# Patient Record
Sex: Male | Born: 1985 | Race: White | Hispanic: No | Marital: Single | Smoking: Never smoker
Health system: Southern US, Community
[De-identification: ages and names within clinical notes are randomized; demographics above are authoritative.]

## PROBLEM LIST (undated history)

## (undated) DIAGNOSIS — F329 Major depressive disorder, single episode, unspecified: Secondary | ICD-10-CM

## (undated) DIAGNOSIS — F32A Depression, unspecified: Secondary | ICD-10-CM

---

## 2006-09-10 ENCOUNTER — Emergency Department (HOSPITAL_COMMUNITY): Admission: EM | Admit: 2006-09-10 | Discharge: 2006-09-10 | Payer: Self-pay | Admitting: Emergency Medicine

## 2007-08-14 IMAGING — CR DG FOOT COMPLETE 3+V*R*
3 series · 3 of 3 positions shown · non-contrast
Comparison: none

CLINICAL DATA: Trauma to the foot.

RIGHT FOOT - 3 VIEW

[view not recorded (1 of 3)]
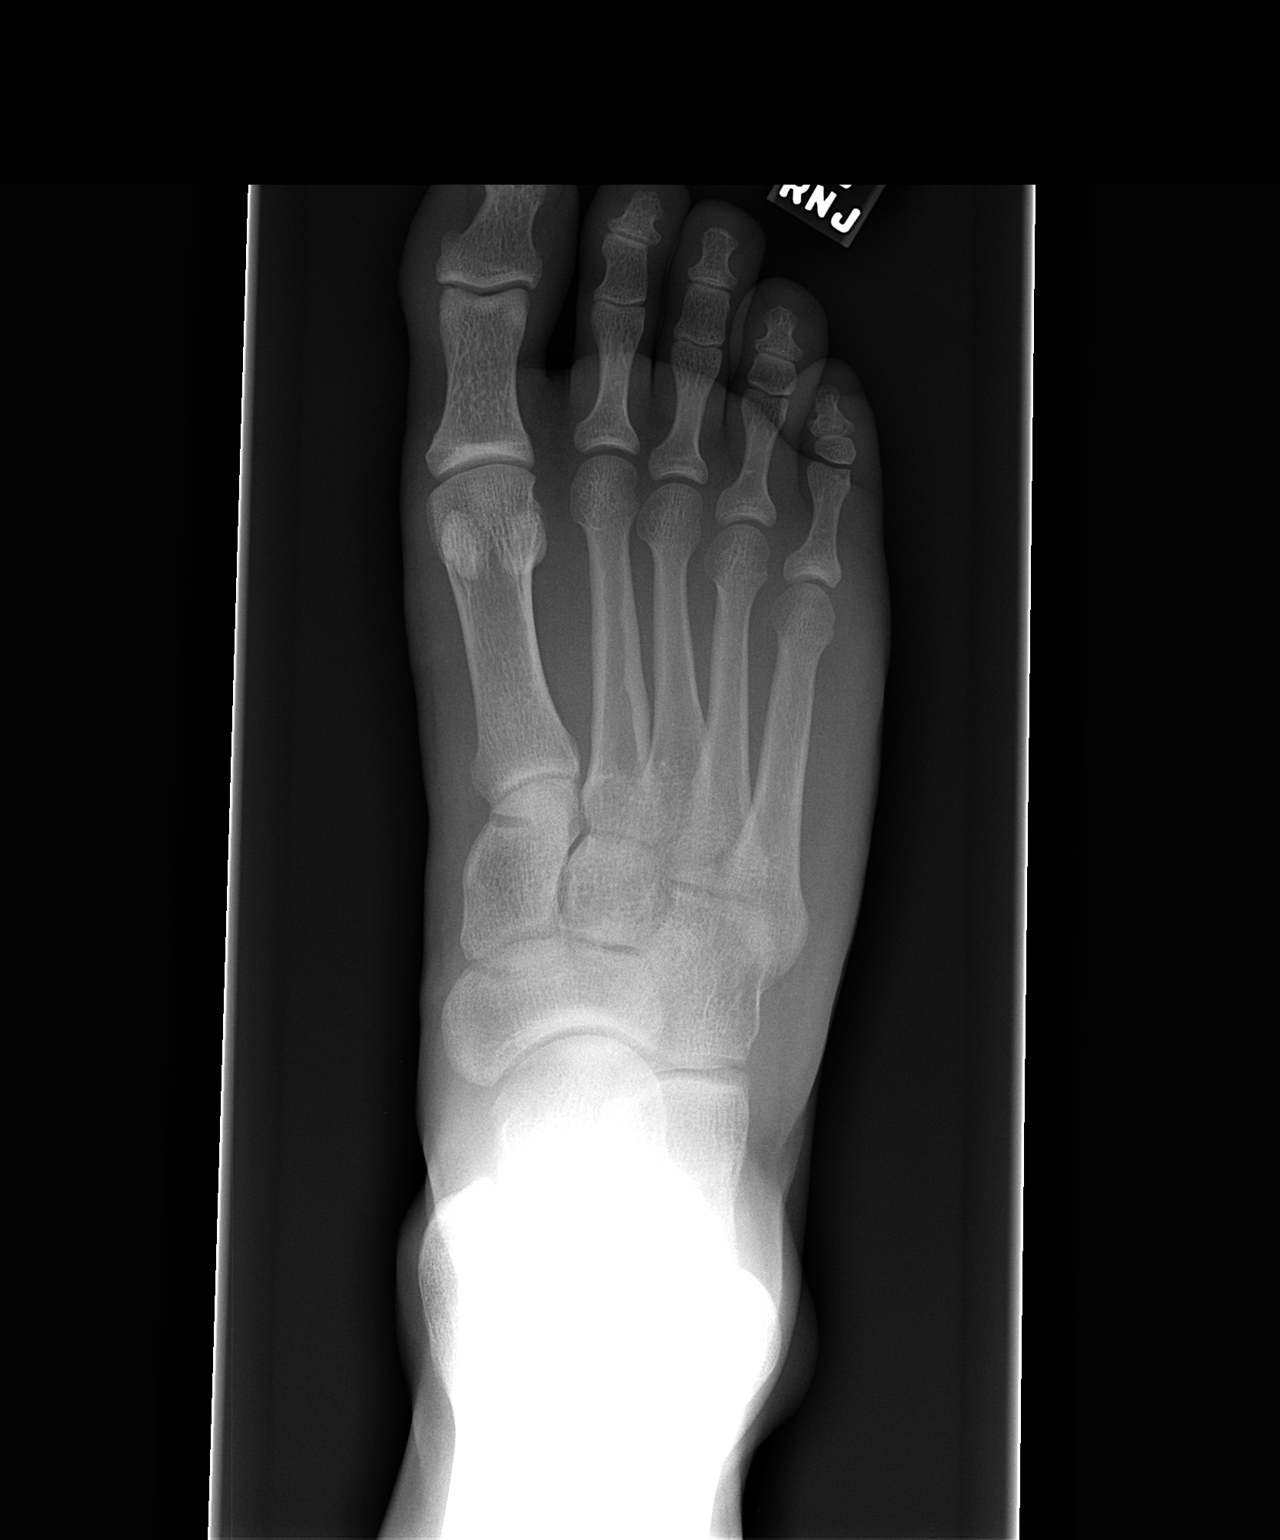

[view not recorded (2 of 3)]
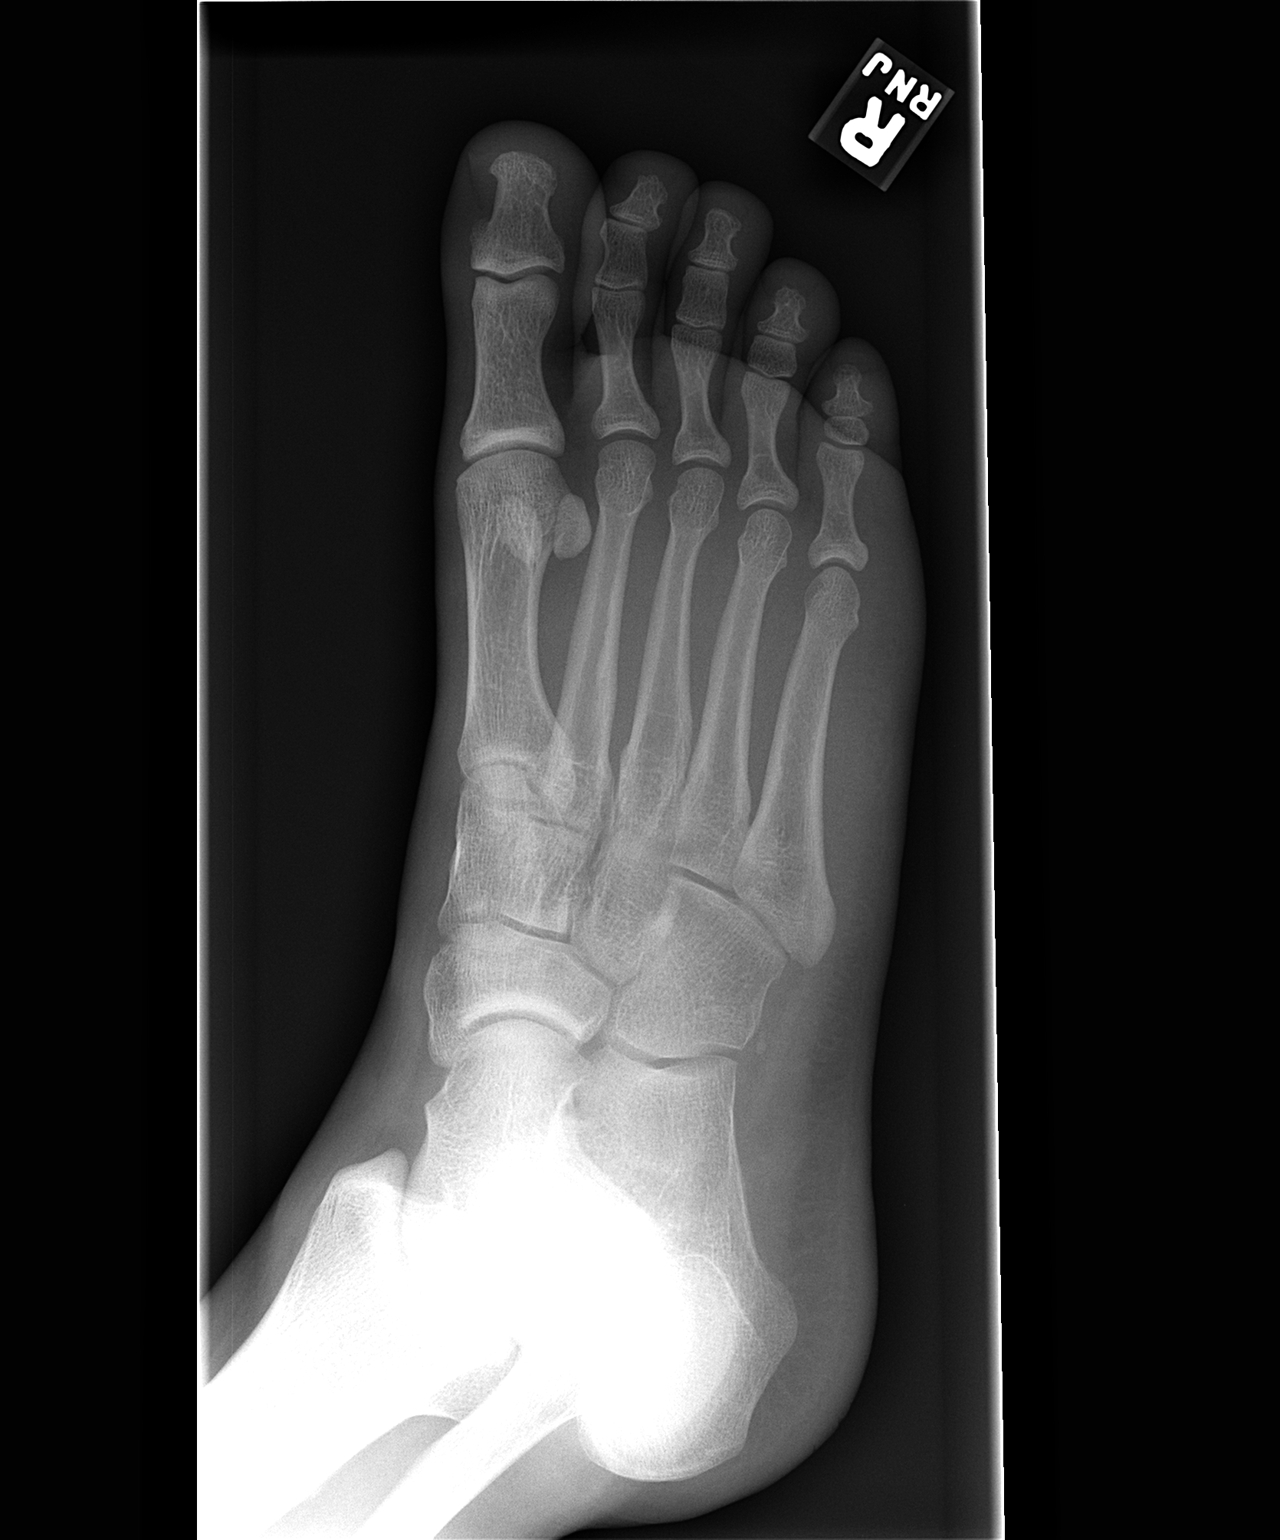

[view not recorded (3 of 3)]
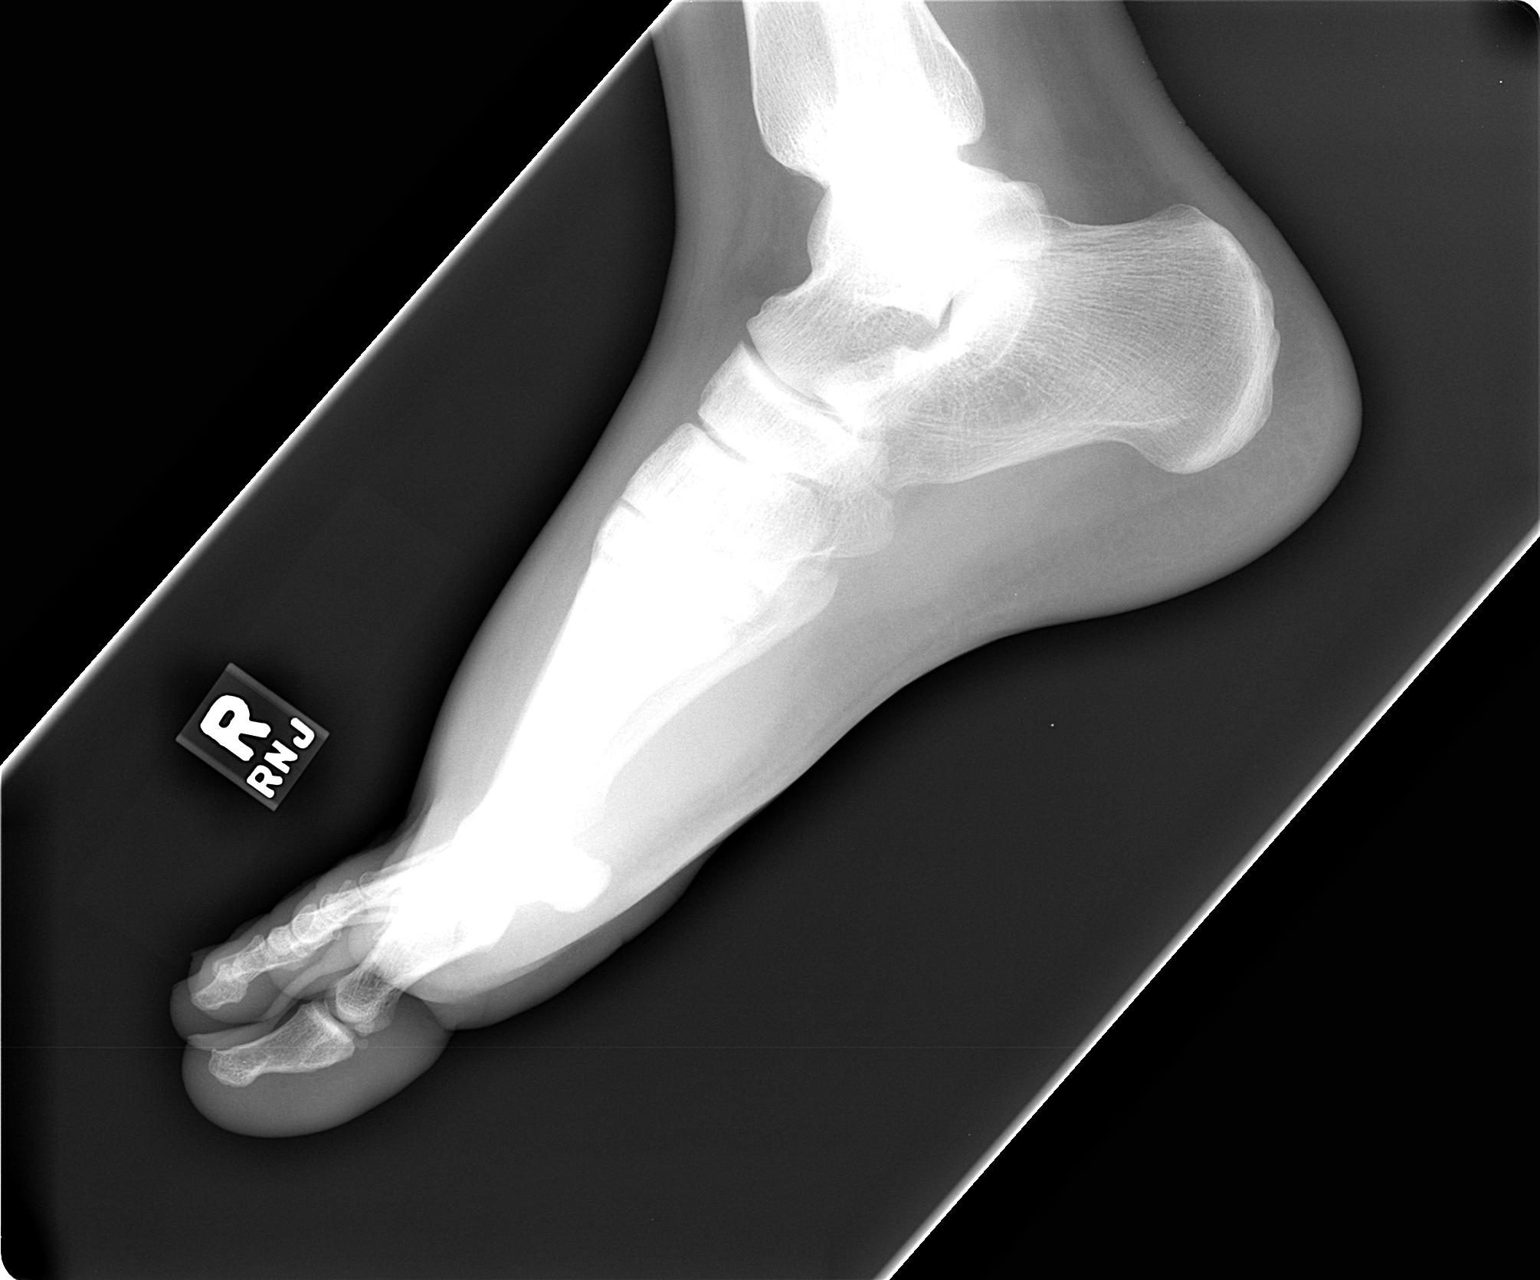

[3 of 3 positions shown; findings below may reference images not displayed]

FINDINGS: Soft tissue swelling overlies the dorsum of the metatarsals.
Alignment at the Lisfranc joint appears normal. No visible fracture. No
dislocation is identified. Incidental is made of an os peroneus.

IMPRESSION

1. No acute bony findings. However, there is dorsal soft tissue swelling.

## 2013-10-16 ENCOUNTER — Emergency Department (HOSPITAL_COMMUNITY)
Admission: EM | Admit: 2013-10-16 | Discharge: 2013-10-16 | Disposition: A | Discharge status: Home / Self Care | Payer: Worker's Compensation | Attending: Emergency Medicine | Admitting: Emergency Medicine

## 2013-10-16 ENCOUNTER — Encounter (HOSPITAL_COMMUNITY): Payer: Self-pay | Admitting: Emergency Medicine

## 2013-10-16 DIAGNOSIS — Y929 Unspecified place or not applicable: Secondary | ICD-10-CM | POA: Insufficient documentation

## 2013-10-16 DIAGNOSIS — S61412A Laceration without foreign body of left hand, initial encounter: Secondary | ICD-10-CM

## 2013-10-16 DIAGNOSIS — Z79899 Other long term (current) drug therapy: Secondary | ICD-10-CM | POA: Insufficient documentation

## 2013-10-16 DIAGNOSIS — F3289 Other specified depressive episodes: Secondary | ICD-10-CM | POA: Insufficient documentation

## 2013-10-16 DIAGNOSIS — Y9389 Activity, other specified: Secondary | ICD-10-CM | POA: Insufficient documentation

## 2013-10-16 DIAGNOSIS — S61409A Unspecified open wound of unspecified hand, initial encounter: Secondary | ICD-10-CM | POA: Insufficient documentation

## 2013-10-16 DIAGNOSIS — W268XXA Contact with other sharp object(s), not elsewhere classified, initial encounter: Secondary | ICD-10-CM | POA: Insufficient documentation

## 2013-10-16 DIAGNOSIS — F329 Major depressive disorder, single episode, unspecified: Secondary | ICD-10-CM | POA: Insufficient documentation

## 2013-10-16 HISTORY — DX: Depression, unspecified: F32.A

## 2013-10-16 HISTORY — DX: Major depressive disorder, single episode, unspecified: F32.9

## 2013-10-16 NOTE — ED Provider Notes (Signed)
CSN: [id number]     Arrival date & time 10/16/13  0920 History  This chart was scribed for non-physician practitioner Mellody Drown, PA-C working with Layla Maw Ward, DO by Joaquin Music, ED Scribe. This patient was seen in room TR09C/TR09C and the patient's care was started at 9:31 AM .   No chief complaint on file.  The history is provided by the patient. No language interpreter was used.   HPI Comments: Jonathan Gill is a 27 y.o. male who presents to the Emergency Department complaining of L hand laceration, between 3rd and 4th digits, that occurred PTA. Pt states he was opening a metal can and states he cut his hand on the sharp edge. Pt states he immediately ran it under water and then placed a towel on it to control the bleeding.  Pt states his last tetanus was approximately 3 years ago. Pt states he is R handed. Pt denies weakness.  Pt states he has a PCP at [facility].  Past Medical History  Diagnosis Date  . Depression    History reviewed. No pertinent past surgical history. No family history on file. History  Substance Use Topics  . Smoking status: Never Smoker   . Smokeless tobacco: Not on file  . Alcohol Use: No    Review of Systems  Skin: Positive for wound.  Neurological: Negative for weakness and numbness.    Allergies  Review of patient's allergies indicates no known allergies.  Home Medications   Current Outpatient Rx  Name  Route  Sig  Dispense  Refill  . FLUoxetine (PROZAC) 20 MG tablet   Oral   Take 1 tablet by mouth 2 (two) times daily.           Triage Vitals:BP 137/85  Pulse 65  Temp(Src) 98.1 F (36.7 C) (Oral)  SpO2 99%  Physical Exam  Nursing note and vitals reviewed. Constitutional: He appears well-developed and well-nourished.  HENT:  Head: Normocephalic and atraumatic.  Neck: Neck supple.  Pulmonary/Chest: Effort normal. No respiratory distress.  Musculoskeletal:       Hands: Left hand: 4 cam linear  laceration to the 3rd MCP. Full ROM, N/V intact.  Good cap refill.  Neurological: He is alert.  Skin: Skin is warm and dry. Laceration noted.    ED Course  Procedures  DIAGNOSTIC STUDIES: Oxygen Saturation is 99% on RA, normal by my interpretation.    COORDINATION OF CARE: 9:36 AM-Discussed treatment plan which includes laceration procedure. Pt agreed to plan.   9:43 AM-LACERATION REPAIR Performed by: Adah Salvage Consent: Verbal consent obtained. Risks and benefits: risks, benefits and alternatives were discussed Patient identity confirmed: provided demographic data Time out performed prior to procedure Prepped and Draped in normal sterile fashion Wound explored, the laceration did not involve the 3rd MCP joint, the extensor tendon was preserved and there was no evidence of laceration.   Laceration Location: In between 3rd MCP and 4th Laceration Length: 4 cm No Foreign Bodies seen or palpated Anesthesia: local infiltration Local anesthetic: lidocaine 2% without epinephrine Anesthetic total: 4 ml Irrigation method: soak, syringe Amount of cleaning: standard, Skin closure: 3-0 Prolene, 4-0 Vicryl Number of sutures: 5 on the outside, 2 internal=7 total Technique: External simple interrupted. Internal Simple interrupted Patient tolerance: Patient tolerated the procedure well with no immediate complications.  10:25 AM- Will clean area and D/C pt with brace. Advised pt to use finger brace for 3 days in order to continue to work.  Labs Review Labs Reviewed -  No data to display Imaging Review No results found.  EKG Interpretation   None      MDM   1. Laceration of left hand, initial encounter    Pt presents with a laceration just PTA, it does not involve the tendon or the joint line, last Td was 3 years ago and will not update at this time.  NV intact. Discussed lab results, imaging results, and treatment plan with the patient. Return precautions given. Reports  understanding and no other concerns at this time.  Patient is stable for discharge at this time.   I personally performed the services described in this documentation, which was scribed in my presence. The recorded information has been reviewed and is accurate.     Clabe Seal, PA-C 10/21/13 1339

## 2013-10-16 NOTE — Progress Notes (Signed)
Orthopedic Tech Progress Note Patient Details:  Jonathan Gill [birth year] [id number] Instructions to apply splint that would immobilizer 3rd and 4th digits from bending. Best plan of action was to go with a fiberglass strip down the middle of 3rd and 4th digits.  Ortho Devices Type of Ortho Device: Finger splint Ortho Device/Splint Location: Left UE Ortho Device/Splint Interventions: Application   Asia R Thompson 10/16/2013, 11:11 AM

## 2013-10-16 NOTE — ED Notes (Signed)
Laceration to space between left ring and middle fingers from can top. Bleeding controlled.

## 2013-10-23 NOTE — ED Provider Notes (Signed)
Medical screening examination/treatment/procedure(s) were performed by non-physician practitioner and as supervising physician I was immediately available for consultation/collaboration.  EKG Interpretation   None         Janifer Gieselman N Mahalie Kanner, DO 10/23/13 1310 

## 2013-10-24 ENCOUNTER — Encounter (HOSPITAL_COMMUNITY): Payer: Self-pay | Admitting: Emergency Medicine

## 2013-10-24 ENCOUNTER — Emergency Department (HOSPITAL_COMMUNITY)
Admission: EM | Admit: 2013-10-24 | Discharge: 2013-10-24 | Disposition: A | Discharge status: Home / Self Care | Payer: Worker's Compensation | Attending: Emergency Medicine | Admitting: Emergency Medicine

## 2013-10-24 DIAGNOSIS — Z79899 Other long term (current) drug therapy: Secondary | ICD-10-CM | POA: Insufficient documentation

## 2013-10-24 DIAGNOSIS — F3289 Other specified depressive episodes: Secondary | ICD-10-CM | POA: Insufficient documentation

## 2013-10-24 DIAGNOSIS — F329 Major depressive disorder, single episode, unspecified: Secondary | ICD-10-CM | POA: Insufficient documentation

## 2013-10-24 DIAGNOSIS — Z4802 Encounter for removal of sutures: Secondary | ICD-10-CM | POA: Insufficient documentation

## 2013-10-24 NOTE — ED Notes (Signed)
Pt here for suture removal. sts they were placed last Friday. Denies fevers at home. sts sometimes there is a little bit of bloody drainage in the middle, noticing it only after working. Denies pain. Nad, skin warm and dry, resp e/u.

## 2013-10-24 NOTE — ED Provider Notes (Signed)
Medical screening examination/treatment/procedure(s) were performed by non-physician practitioner and as supervising physician I was immediately available for consultation/collaboration.  EKG Interpretation   None         Dagmar HaitWilliam Valene Villa, MD 10/24/13 [id number]

## 2013-10-24 NOTE — Discharge Instructions (Signed)
Suture Removal °Your caregiver has removed your sutures today. If skin adhesive strips were applied at the time of suturing, or applied following removal of the sutures today, they will begin to peel off in a couple more days. If skin adhesive strips remain after 14 days, they may be removed. °HOME CARE INSTRUCTIONS  °· Change any bandages (dressings) at least once a day or as directed by your caregiver. If the bandage sticks, soak it off with warm, soapy water. °· Wash the area with soap and water to remove all the cream or ointment (if you were instructed to use any) 2 times a day. Rinse off the soap and pat the area dry with a clean towel. °· Reapply cream or ointment as directed by your caregiver. This will help prevent infection and keep the bandage from sticking. °· Keep the wound area dry and clean. If the bandage becomes wet, dirty, or develops a bad smell, change it as soon as possible. °· Only take over-the-counter or prescription medicines for pain, discomfort, or fever as directed by your caregiver. °· Use sunscreen when out in the sun. New scars become sunburned easily. °· Return to your caregivers office in in 7 days or as directed to have your sutures removed. °You may need a tetanus shot if: °· You cannot remember when you had your last tetanus shot. °· You have never had a tetanus shot. °· The injury broke your skin. °If you got a tetanus shot, your arm may swell, get red, and feel warm to the touch. This is common and not a problem. If you need a tetanus shot and you choose not to have one, there is a rare chance of getting tetanus. Sickness from tetanus can be serious. °SEEK IMMEDIATE MEDICAL CARE IF:  °· There is redness, swelling, or increasing pain in the wound. °· Pus is coming from the wound. °· An unexplained oral temperature above 102° F (38.9° C) develops. °· You notice a bad smell coming from the wound or dressing. °· The wound breaks open (edges not staying together) after sutures have  been removed. °Document Released: 07/03/2001 Document Revised: 12/31/2011 Document Reviewed: 05/20/2013 °[employer]® Patient Information ©2014 [employer]. ° °

## 2013-10-24 NOTE — ED Provider Notes (Signed)
CSN: [id number]     Arrival date & time 10/24/13  [id number] History   First MD Initiated Contact with Patient 10/24/13 [phone]     Chief Complaint  Patient presents with  . Suture / Staple Removal   (Consider location/radiation/quality/duration/timing/severity/associated sxs/prior Treatment) HPI Comments: Patient presents today for suture removal.  Sutures were placed in the left hand eight days ago.  He reports that the laceration is healing well.  He denies any drainage.  Denies redness or swelling of the area.  Denies fever or chills.   The history is provided by the patient.    Past Medical History  Diagnosis Date  . Depression    No past surgical history on file. No family history on file. History  Substance Use Topics  . Smoking status: Never Smoker   . Smokeless tobacco: Not on file  . Alcohol Use: No    Review of Systems  All other systems reviewed and are negative.    Allergies  Review of patient's allergies indicates no known allergies.  Home Medications   Current Outpatient Rx  Name  Route  Sig  Dispense  Refill  . FLUoxetine (PROZAC) 20 MG tablet   Oral   Take 1 tablet by mouth 2 (two) times daily.          BP 134/66  Pulse 67  Temp(Src) 98.5 F (36.9 C) (Oral)  Resp 16  Ht 6' (1.829 m)  Wt 215 lb (97.523 kg)  BMI 29.15 kg/m2  SpO2 100% Physical Exam  Nursing note and vitals reviewed. Constitutional: He appears well-developed and well-nourished.  HENT:  Head: Normocephalic and atraumatic.  Neck: Normal range of motion. Neck supple.  Cardiovascular: Normal rate, regular rhythm and normal heart sounds.   Pulses:      Radial pulses are 2+ on the right side, and 2+ on the left side.  Pulmonary/Chest: Effort normal and breath sounds normal.  Musculoskeletal: Normal range of motion.  Full ROM of fingers of the left hand  Neurological: He is alert.  Skin: Skin is warm and dry.  Sutures of left hand intact.  Skin well approximated Laceration appears to  be healing well.  No drainage.  No surrounding erythema or edema.  Psychiatric: He has a normal mood and affect.    ED Course  Procedures (including critical care time) Labs Review Labs Reviewed - No data to display Imaging Review No results found.  EKG Interpretation   None       MDM  No diagnosis found. Patient presenting for suture removal.  Laceration appears to be healing well.  Skin well approximated.  No signs of infection.  Sutures removed without difficulty.  Patient stable for discharge.    Santiago GladHeather Vasilisa Vore, PA-C 10/24/13 [phone]
